# Patient Record
Sex: Female | Born: 2000 | Race: Black or African American | Hispanic: No | Marital: Single | Smoking: Never smoker
Health system: Southern US, Community
[De-identification: ages and names within clinical notes are randomized; demographics above are authoritative.]

---
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
## 2023-08-17 ENCOUNTER — Encounter: Payer: Self-pay | Admitting: Nurse Practitioner

## 2023-08-17 ENCOUNTER — Encounter (LOCAL_COMMUNITY_HEALTH_CENTER): Payer: Self-pay | Admitting: Nurse Practitioner

## 2023-08-23 ENCOUNTER — Ambulatory Visit: Admitting: Family Medicine

## 2023-08-23 VITALS — BP 131/68 | HR 84 | Ht 67.0 in | Wt 139.0 lb

## 2023-08-23 DIAGNOSIS — Z30013 Encounter for initial prescription of injectable contraceptive: Secondary | ICD-10-CM

## 2023-08-23 DIAGNOSIS — N898 Other specified noninflammatory disorders of vagina: Secondary | ICD-10-CM | POA: Insufficient documentation

## 2023-08-23 DIAGNOSIS — Z113 Encounter for screening for infections with a predominantly sexual mode of transmission: Secondary | ICD-10-CM

## 2023-08-23 DIAGNOSIS — Z3042 Encounter for surveillance of injectable contraceptive: Secondary | ICD-10-CM | POA: Insufficient documentation

## 2023-08-23 LAB — WET PREP FOR TRICH, YEAST, CLUE
Clue Cell Exam: NEGATIVE
Trichomonas Exam: NEGATIVE
Yeast Exam: NEGATIVE

## 2023-08-23 MED ORDER — MEDROXYPROGESTERONE ACETATE 150 MG/ML IM SUSP
150.0000 mg | INTRAMUSCULAR | Status: AC
  Administered 2023-08-23 – 2024-01-17 (×2): 150 mg via INTRAMUSCULAR

## 2023-08-23 NOTE — Progress Notes (Signed)
 [facility] [address], [address] [place] [address] Main phone: [phone]  Family Planning Visit - Initial Visit  Subjective:  Heather Riddle is a 23 y.o.  No obstetric history on file.   being seen today for an initial annual visit and to discuss reproductive life planning.  The patient is currently using hormonal injection for pregnancy prevention. Patient does not want a pregnancy in the next year.   Patient reports they are looking for a method with the following characteristics:  High efficacy at preventing pregnancy Long term method Method they can control starting and stopping  Patient has the following medical conditions: Patient Active Problem List   Diagnosis Date Noted   Encounter for surveillance of injectable contraceptive 08/23/2023   Vaginal discharge 08/23/2023   Chief Complaint  Patient presents with   Annual Exam   Acute Visit    Pt is here for depo injection   HPI Patient reports intermittent pruritic vaginal discharge, she wonders if she has a yeast infection. Also desires Depo Provera and chlamydia test of cure. No history of HTN, DVT, PE, stroke, migraine with aura, congenital clotting disorders. Intermittent social tobacco smoking. Some MJ use.   Depo Provera  [redacted]w[redacted]d since last injection at [facility]  Chlamydia Positive test 07/06/2023 at [facility]  S/p doxycycline treatment for both herself and her partner [facility] to [facility] today  Review of Systems  Constitutional:  Negative for fever, malaise/fatigue and weight loss.  Respiratory:  Negative for shortness of breath.   Cardiovascular:  Negative for chest pain and palpitations.   Diabetes screening This patient is 23 y.o. with a BMI of Body mass index is 21.77 kg/m.  Is patient eligible for diabetes screening (age >35 and BMI >25)?  no  Was Hgb A1c ordered? not applicable  STI screening Patient reports 2 of partners in last year.   Does this patient desire STI screening?  Yes  Hepatitis C screening Has patient been screened once for HCV in the past?  No  No results found for: HCVAB  Does the patient meet criteria for HCV testing? Yes  Hepatitis B screening Does the patient meet criteria for HBV testing? Yes  Cervical Cancer Screening  No Cervical Cancer Screening results to display.  Health Maintenance Due  Topic Date Due   CHLAMYDIA SCREENING  Never done   HPV VACCINES (1 - 3-dose series) Never done   HIV Screening  Never done   Meningococcal B Vaccine (1 of 2 - Standard) Never done   Hepatitis C Screening  Never done   DTaP/Tdap/Td (1 - Tdap) Never done   Cervical Cancer Screening (Pap smear)  Never done   COVID-19 Vaccine (2 - 2024-25 season) 10/31/2022    The following portions of the patient's history were reviewed and updated as appropriate: allergies, current medications, past family history, past medical history, past social history, past surgical history and problem list. Problem list updated.  See flowsheet for further details and programmatic requirements Hyperlink available at the top of the signed note in blue.  Flow sheet content below:  Pregnancy Intention Screening Does the patient want to become pregnant in the next year?: No Does the patient's partner want to become pregnant in the next year?: No Would the patient like to discuss contraceptive options today?: Yes Risk Factors for Hep B Household, sexual, or needle sharing contact of a person infected with Hep B: No Sexual contact with a person who  uses drugs not as prescribed?: Yes Currently or Ever used drugs not as prescribed: Yes HIV Positive: No PRep Patient: No Men who have sex with men: N/A Have Hepatitis C: No History of Incarceration: No History of Homeslessness?: No Anal sex following anal drug use?: N/A Risk Factors for Hep C Currently using drugs not as prescribed: Yes Sexual partner(s) currently using drugs as  not prescribed: Yes History of drug use: No HIV Positive: No People with a history of incarceration: No People born between the years of 1945 and [birth year]: No Contraception Wrap Up Current Method: Hormonal Injection End Method: Hormonal Injection Contraception Counseling Provided: Yes How was the end contraceptive method provided?: Provided on site  Objective:   Vitals:   08/23/23 1346  BP: 131/68  Pulse: 84  Weight: 139 lb (63 kg)  Height: 5' 7 (1.702 m)   Physical Exam Vitals and nursing note reviewed.  Constitutional:      General: She is not in acute distress.    Appearance: Normal appearance. She is normal weight. She is not toxic-appearing.  HENT:     Head: Normocephalic.     Mouth/Throat:     Mouth: Mucous membranes are moist.   Cardiovascular:     Rate and Rhythm: Normal rate and regular rhythm.     Heart sounds: Normal heart sounds.  Pulmonary:     Effort: Pulmonary effort is normal.     Breath sounds: Normal breath sounds.  Abdominal:     Palpations: Abdomen is soft.  Genitourinary:    Comments: Declined genital exam- no symptoms, self swabbed  Musculoskeletal:        General: Normal range of motion.     Cervical back: Neck supple. No rigidity or tenderness.  Lymphadenopathy:     Head:     Right side of head: No submandibular, preauricular or posterior auricular adenopathy.     Left side of head: No submandibular, preauricular or posterior auricular adenopathy.     Cervical: No cervical adenopathy.     Right cervical: No superficial or posterior cervical adenopathy.    Left cervical: No superficial or posterior cervical adenopathy.     Upper Body:     Right upper body: No supraclavicular adenopathy.     Left upper body: No supraclavicular adenopathy.   Skin:    General: Skin is warm and dry.     Capillary Refill: Capillary refill takes less than 2 seconds.     Coloration: Skin is not jaundiced or pale.     Findings: No bruising, erythema, lesion or  rash.   Neurological:     Mental Status: She is alert and oriented to person, place, and time.   Psychiatric:        Mood and Affect: Mood normal.        Behavior: Behavior normal.    Assessment and Plan:  Heather Alas is a 23 y.o. female presenting to the [facility] Department for an initial annual wellness/contraceptive visit  Contraception counseling:  Reviewed options based on patient desire and reproductive life plan. Patient is interested in Hormonal Injection. This was provided to the patient today.   Risks, benefits, and typical effectiveness rates were reviewed.  Questions were answered.  Written information was also given to the patient to review.    The patient will follow up in  3 years for surveillance.  The patient was told to call with any further questions, or with any concerns about this method of contraception.  Emphasized use  of condoms 100% of the time for STI prevention.  Emergency Contraception Precautions (ECP): Patient assessed for need of ECP. She is not a candidate based on depo provera within recommended dates.   Screening examination for venereal disease -     WET PREP FOR TRICH, YEAST, CLUE -     Chlamydia/Gonorrhea [facility]  Encounter for surveillance of injectable contraceptive -     medroxyPROGESTERone Acetate  Vaginal discharge Assessment & Plan: Acute, intermittent. History congruent with yeast vaginitis, but patient politely declines physical exam and no yeast found on self-collected swabs. No empiric treatment today, encouraged to return if symptoms worsen.    No follow-ups on file.  No future appointments.  Betsey CHRISTELLA Helling, MD

## 2023-08-23 NOTE — Assessment & Plan Note (Signed)
 Acute, intermittent. History congruent with yeast vaginitis, but patient politely declines physical exam and no yeast found on self-collected swabs. No empiric treatment today, encouraged to return if symptoms worsen.

## 2023-08-23 NOTE — Progress Notes (Signed)
 Patient is here for family planning visit. Family planning education card given to patient. Wet prep results reviewed; no treatment required per standing orders. Depo given in R deltoid; tolerated well. Reminder card given for next depo.   Doyce CINDERELLA Shuck, RN

## 2023-08-26 NOTE — Progress Notes (Signed)
 Erroneous Encounter-Disregard.  Patient did not show for appointment. No charge.  Leary Provencal L. Averlee Swartz, FNP-C

## 2024-01-17 ENCOUNTER — Ambulatory Visit (LOCAL_COMMUNITY_HEALTH_CENTER): Payer: Self-pay

## 2024-01-17 ENCOUNTER — Other Ambulatory Visit: Payer: Self-pay

## 2024-01-17 ENCOUNTER — Ambulatory Visit: Payer: Self-pay

## 2024-01-17 VITALS — BP 117/74 | HR 73 | Ht 67.0 in | Wt 138.6 lb

## 2024-01-17 DIAGNOSIS — Z3042 Encounter for surveillance of injectable contraceptive: Secondary | ICD-10-CM

## 2024-01-17 DIAGNOSIS — N6320 Unspecified lump in the left breast, unspecified quadrant: Secondary | ICD-10-CM

## 2024-01-17 DIAGNOSIS — N6310 Unspecified lump in the right breast, unspecified quadrant: Secondary | ICD-10-CM

## 2024-01-17 DIAGNOSIS — N63 Unspecified lump in unspecified breast: Secondary | ICD-10-CM

## 2024-01-17 NOTE — Progress Notes (Signed)
 Pt here for acute visit to restart Depo.  Also requesting breast exam.  Depo Provera  150mg  IM given in L deltoid without complications.  Depo reminder card given.  Encouraged condom use as a backup x 7 days.  Condoms declined.  Pt to call if further questions or concerns or does not hear from referral to [facility], RN

## 2024-01-17 NOTE — Progress Notes (Addendum)
 [facility] [address] [place] [address] Main phone: [phone]  Women's Health Problem Visit   Subjective:  Heather Riddle is a 23 y.o. being seen today for late Depo and breast pain.   Chief Complaint  Patient presents with   Acute Visit    Requesting breast exam and restart Depo   HPI: Patient reports desire for Depo Provera . She took a 2 month break because she was worried about her bone health. Breast tenderness on both sides for last week or so, not typical for her. LMP was 01/05/24. Last sex 2-3 days ago. Condom use sometimes, last unprotected sex last Thursday.  Health Maintenance Due  Topic Date Due   CHLAMYDIA SCREENING  Never done   HPV VACCINES (1 - 3-dose series) Never done   HIV Screening  Never done   Meningococcal B Vaccine (1 of 2 - Standard) Never done   Hepatitis C Screening  Never done   DTaP/Tdap/Td (1 - Tdap) Never done   Cervical Cancer Screening (Pap smear)  Never done   Influenza Vaccine  09/30/2023   COVID-19 Vaccine (2 - 2025-26 season) 10/31/2023    Review of Systems  All other systems reviewed and are negative.   The following portions of the patient's history were reviewed and updated as appropriate: allergies, current medications, past family history, past medical history, past social history, past surgical history and problem list. Problem list updated.  See flowsheet for other program required questions.  Objective:   Vitals:   01/17/24 1313  BP: 117/74  Pulse: 73  Weight: 138 lb 9.6 oz (62.9 kg)  Height: 5' 7 (1.702 m)    Physical Exam Exam conducted with a chaperone present Ilah B).  Constitutional:      Appearance: Normal appearance.  HENT:     Head: Normocephalic.     Mouth/Throat:     Mouth: Mucous membranes are moist.  Eyes:     General: No scleral icterus.       Right eye: No discharge.        Left eye: No discharge.  Pulmonary:     Effort:  Pulmonary effort is normal.  Chest:  Breasts:    Tanner Score is 5.     Right: Mass and tenderness present. No swelling, bleeding, inverted nipple, nipple discharge or skin change.     Left: Mass and tenderness present. No swelling, bleeding, inverted nipple, nipple discharge or skin change.       Comments: 2-3 cm oval, mobile, firm mass at 10 o clock on right side near axillary area 1 cm round, mobile firm mass at 1 o clock on the left side near axillary area Lymphadenopathy:     Upper Body:     Right upper body: No supraclavicular or axillary adenopathy.     Left upper body: No supraclavicular or axillary adenopathy.  Skin:    General: Skin is warm and dry.  Neurological:     General: No focal deficit present.     Mental Status: She is alert.  Psychiatric:        Mood and Affect: Mood normal.        Behavior: Behavior normal.    Assessment and Plan:  Heather Riddle is a 23 y.o. female presenting to the [facility] Department for a Women's Health problem visit  1. Mass of breast, unspecified laterality (Primary)  -2-3 cm oval, mobile, firm mass at 10 o clock on right  side near axillary area -1 cm round, mobile firm mass at 1 o clock on the left side near axillary area - Generalized tenderness of breasts for last week - Family history of breast cancer in multiple aunts on paternal and maternal side - Advised bilateral breast US  - B[facility]CP referral sent to [facility]  2. Encounter for surveillance of injectable contraceptive  - Depo today - Last unprotected sex was Thursday, outside ECP window - Advised taking a home pregnancy test in mid December - Discussed optimizing bone health - walking, weight bearing exercise, good dietary calcium and good nutrition, vitamin D supplementation, and do not smoke  No follow-ups on file.  Damien FORBES Satchel, NP

## 2024-01-30 NOTE — Progress Notes (Unsigned)
 Ms. Heather Riddle is a 23 y.o. No obstetric history on file. female who presents to [facility] clinic today with {Blank single:19197::no complaints,complaint of} ***.    Pap Smear: Pap smear completed today. Last Pap smear was *** at *** clinic and was {Blank single:19197::normal,abnormal - ***}. Per patient has {Blank single:19197::no history,history} of an abnormal Pap smear. Last Pap smear result {Blank single:19197::is available in,is not available in} Epic.   Physical exam: Breasts Breasts symmetrical. No skin abnormalities bilateral breasts. No nipple retraction bilateral breasts. No nipple discharge bilateral breasts. No lymphadenopathy. No lumps palpated bilateral breasts.       Pelvic/Bimanual Ext Genitalia No lesions, no swelling and no discharge observed on external genitalia.        Vagina Vagina pink and normal texture. No lesions or discharge observed in vagina.        Cervix Cervix is present. Cervix pink and of normal texture. No discharge observed.    Uterus Uterus is present and palpable. Uterus in normal position and normal size.        Adnexae Bilateral ovaries present and palpable. No tenderness on palpation.         Rectovaginal No rectal exam completed today since patient had no rectal complaints. No skin abnormalities observed on exam.     Smoking History: Patient has {Blank single:19197::never smoked,is a former smoker,is a current smoker at *** packs per day} ***referred to quit line.    Patient Navigation: Patient education provided. Access to services provided for patient through [facility] program.    Breast and Cervical Cancer Risk Assessment: Patient {Blank single:19197::has,does not have} family history of breast cancer, known genetic mutations, or radiation treatment to the chest before age 61. Patient {Blank single:19197::has,does not have} history of cervical dysplasia, immunocompromised, or DES exposure in-utero.  Risk  Assessment   No risk assessment data     A: [facility] exam with pap smear Complaint of ***  P: Referred patient to the [facility] for bilateral breast ultrasounds. Appointment scheduled Tuesday, January 31, 2024 at 1120.  Driscilla Wanda SQUIBB, RN 01/30/2024 9:10 AM

## 2024-01-31 ENCOUNTER — Ambulatory Visit
Admission: RE | Admit: 2024-01-31 | Discharge: 2024-01-31 | Disposition: A | Discharge status: Home / Self Care | Payer: Self-pay | Source: Ambulatory Visit | Attending: Obstetrics and Gynecology | Admitting: Obstetrics and Gynecology

## 2024-01-31 ENCOUNTER — Ambulatory Visit: Payer: Self-pay | Attending: Obstetrics and Gynecology

## 2024-01-31 ENCOUNTER — Ambulatory Visit
Admission: RE | Admit: 2024-01-31 | Discharge: 2024-01-31 | Disposition: A | Payer: Self-pay | Source: Ambulatory Visit | Attending: Obstetrics and Gynecology | Admitting: Obstetrics and Gynecology

## 2024-01-31 ENCOUNTER — Other Ambulatory Visit: Payer: Self-pay | Admitting: Obstetrics and Gynecology

## 2024-01-31 VITALS — BP 105/64 | Wt 138.0 lb

## 2024-01-31 DIAGNOSIS — Z01419 Encounter for gynecological examination (general) (routine) without abnormal findings: Secondary | ICD-10-CM

## 2024-01-31 DIAGNOSIS — R928 Other abnormal and inconclusive findings on diagnostic imaging of breast: Secondary | ICD-10-CM

## 2024-01-31 DIAGNOSIS — N6320 Unspecified lump in the left breast, unspecified quadrant: Secondary | ICD-10-CM | POA: Insufficient documentation

## 2024-01-31 DIAGNOSIS — N6311 Unspecified lump in the right breast, upper outer quadrant: Secondary | ICD-10-CM

## 2024-01-31 DIAGNOSIS — N6310 Unspecified lump in the right breast, unspecified quadrant: Secondary | ICD-10-CM | POA: Insufficient documentation

## 2024-01-31 DIAGNOSIS — Z803 Family history of malignant neoplasm of breast: Secondary | ICD-10-CM

## 2024-01-31 DIAGNOSIS — N644 Mastodynia: Secondary | ICD-10-CM

## 2024-01-31 DIAGNOSIS — Z1239 Encounter for other screening for malignant neoplasm of breast: Secondary | ICD-10-CM

## 2024-01-31 DIAGNOSIS — N6323 Unspecified lump in the left breast, lower outer quadrant: Secondary | ICD-10-CM

## 2024-01-31 NOTE — Patient Instructions (Signed)
 Explained breast self awareness with Heather Riddle. Patient did not need a Pap smear today due to last Pap smear was 2 years ago per patient. Let her know [facility] will cover Pap smears every 3 years unless has a history of abnormal Pap smears. Referred patient to the [facility] for bilateral breast ultrasounds. Appointment scheduled Tuesday, January 31, 2024 at 1120. Patient aware of appointment and will be there. Heather Riddle verbalized understanding.  Vivaan Helseth, Wanda Ship, RN 10:31 AM

## 2024-02-08 ENCOUNTER — Inpatient Hospital Stay
Admission: RE | Admit: 2024-02-08 | Discharge: 2024-02-08 | Attending: Obstetrics and Gynecology | Admitting: Obstetrics and Gynecology

## 2024-02-08 ENCOUNTER — Ambulatory Visit
Admission: RE | Admit: 2024-02-08 | Discharge: 2024-02-08 | Disposition: A | Discharge status: Home / Self Care | Source: Ambulatory Visit | Attending: Obstetrics and Gynecology | Admitting: Obstetrics and Gynecology

## 2024-02-08 DIAGNOSIS — D241 Benign neoplasm of right breast: Secondary | ICD-10-CM | POA: Diagnosis present

## 2024-02-08 DIAGNOSIS — R928 Other abnormal and inconclusive findings on diagnostic imaging of breast: Secondary | ICD-10-CM

## 2024-02-08 HISTORY — PX: BREAST BIOPSY: SHX20

## 2024-02-08 MED ORDER — LIDOCAINE-EPINEPHRINE 1 %-1:100000 IJ SOLN
10.0000 mL | Freq: Once | INTRAMUSCULAR | Status: AC
  Administered 2024-02-08: 10 mL via INTRADERMAL
  Filled 2024-02-08: qty 10

## 2024-02-08 MED ORDER — LIDOCAINE 1 % OPTIME INJ - NO CHARGE
2.0000 mL | Freq: Once | INTRAMUSCULAR | Status: AC
  Administered 2024-02-08: 2 mL via INTRADERMAL
  Filled 2024-02-08: qty 2

## 2024-02-09 LAB — SURGICAL PATHOLOGY

## 2024-02-29 ENCOUNTER — Telehealth: Payer: Self-pay | Admitting: Family Medicine

## 2024-04-23 ENCOUNTER — Ambulatory Visit

## 2024-05-03 ENCOUNTER — Ambulatory Visit
